# Patient Record
Sex: Female | Born: 1970 | Race: White | Hispanic: No | Marital: Married | Smoking: Never smoker
Health system: Southern US, Community
[De-identification: ages and names within clinical notes are randomized; demographics above are authoritative.]

## PROBLEM LIST (undated history)

## (undated) DIAGNOSIS — Z8249 Family history of ischemic heart disease and other diseases of the circulatory system: Secondary | ICD-10-CM

## (undated) DIAGNOSIS — E7800 Pure hypercholesterolemia, unspecified: Secondary | ICD-10-CM

## (undated) DIAGNOSIS — Z6826 Body mass index (BMI) 26.0-26.9, adult: Secondary | ICD-10-CM

## (undated) HISTORY — DX: Pure hypercholesterolemia, unspecified: E78.00

## (undated) HISTORY — DX: Family history of ischemic heart disease and other diseases of the circulatory system: Z82.49

## (undated) HISTORY — DX: Body mass index (BMI) 26.0-26.9, adult: Z68.26

---

## 1998-02-08 ENCOUNTER — Other Ambulatory Visit: Admission: RE | Admit: 1998-02-08 | Discharge: 1998-02-08 | Payer: Self-pay | Admitting: Obstetrics and Gynecology

## 1999-02-12 ENCOUNTER — Inpatient Hospital Stay (HOSPITAL_COMMUNITY): Admission: AD | Admit: 1999-02-12 | Discharge: 1999-02-14 | Payer: Self-pay | Admitting: Obstetrics and Gynecology

## 1999-03-25 ENCOUNTER — Other Ambulatory Visit: Admission: RE | Admit: 1999-03-25 | Discharge: 1999-03-25 | Payer: Self-pay | Admitting: Obstetrics and Gynecology

## 2000-04-19 ENCOUNTER — Other Ambulatory Visit: Admission: RE | Admit: 2000-04-19 | Discharge: 2000-04-19 | Payer: Self-pay | Admitting: Obstetrics and Gynecology

## 2001-09-15 ENCOUNTER — Other Ambulatory Visit: Admission: RE | Admit: 2001-09-15 | Discharge: 2001-09-15 | Payer: Self-pay | Admitting: Obstetrics and Gynecology

## 2002-09-18 ENCOUNTER — Other Ambulatory Visit: Admission: RE | Admit: 2002-09-18 | Discharge: 2002-09-18 | Payer: Self-pay | Admitting: Obstetrics and Gynecology

## 2003-03-26 ENCOUNTER — Inpatient Hospital Stay (HOSPITAL_COMMUNITY): Admission: RE | Admit: 2003-03-26 | Discharge: 2003-03-28 | Payer: Self-pay | Admitting: Obstetrics and Gynecology

## 2003-03-26 ENCOUNTER — Encounter: Payer: Self-pay | Admitting: Obstetrics and Gynecology

## 2003-03-26 ENCOUNTER — Encounter (INDEPENDENT_AMBULATORY_CARE_PROVIDER_SITE_OTHER): Payer: Self-pay | Admitting: Specialist

## 2003-04-27 ENCOUNTER — Other Ambulatory Visit: Admission: RE | Admit: 2003-04-27 | Discharge: 2003-04-27 | Payer: Self-pay | Admitting: Obstetrics and Gynecology

## 2004-06-19 ENCOUNTER — Other Ambulatory Visit: Admission: RE | Admit: 2004-06-19 | Discharge: 2004-06-19 | Payer: Self-pay | Admitting: Obstetrics and Gynecology

## 2005-09-07 ENCOUNTER — Other Ambulatory Visit: Admission: RE | Admit: 2005-09-07 | Discharge: 2005-09-07 | Payer: Self-pay | Admitting: Obstetrics and Gynecology

## 2008-04-09 ENCOUNTER — Encounter: Admission: RE | Admit: 2008-04-09 | Discharge: 2008-04-09 | Payer: Self-pay | Admitting: Obstetrics and Gynecology

## 2009-04-04 ENCOUNTER — Encounter: Admission: RE | Admit: 2009-04-04 | Discharge: 2009-04-04 | Payer: Self-pay | Admitting: Obstetrics and Gynecology

## 2010-10-27 ENCOUNTER — Encounter: Payer: Self-pay | Admitting: Obstetrics and Gynecology

## 2011-02-20 NOTE — Discharge Summary (Signed)
NAME:  Meagan Donovan, Meagan Donovan                      ACCOUNT NO.:  [id number]   MEDICAL RECORD NO.:  [id number]                   PATIENT TYPE:  INP   LOCATION:  9145                                 FACILITY:  [facility]   PHYSICIAN:  Dineen Kid. Rana Snare, M.D.                 DATE OF BIRTH:  Jan 12, 1971   DATE OF ADMISSION:  03/26/2003  DATE OF DISCHARGE:  03/28/2003                                 DISCHARGE SUMMARY   ADMISSION DIAGNOSES:  1. Intrauterine pregnancy at 38-1/7 weeks estimated gestational age.  2. Previous cesarean delivery times two.  3. Labor.  4. Desires permanent sterilization.   DISCHARGE DIAGNOSES:  1. Status post low-transverse cesarean section to a viable female infant.  2. Permanent sterilization.   PROCEDURES:  1. Repeat low-transverse cesarean section.  2. Bilateral tubal ligation, modified Pomeroy method.   REASON FOR ADMISSION:  Please see dictated H&P.   HOSPITAL COURSE:  #1.  The patient was a 40 year old gravida 3, para 2 that  was admitted to [facility] at 38-1/7 weeks who was having  uterine contractions and low abdominal pain.  She denied ruptured membranes.  She had had two previous cesarean deliveries and was scheduled for a  cesarean in approximately three days.  The patient had also desired  permanent sterilization.  The decision was made to proceed with the cesarean  delivery and the patient was taken to the operating room where spinal  anesthesia was administered without difficulty.  A low-transverse incision  was made with delivery of a viable female infant weight 7 pounds 2 ounces  with Apgar's of 8 at one minute and 9 at five minutes.  Bilateral tubal  ligation was performed.  The patient tolerated the procedure well and was  taken to the recovery room in stable condition.   #2.  On postoperative day one:  Vital signs were stable.  The patient had  good return of bowel function.  Fundus was firm and nontender.  Abdominal  dressing was  clean, dry and intact.  Labs revealed a hemoglobin of 9.3,  platelet count of 186,000, WBC count of 10.9.  On postoperative day two:  The patient desired early discharge.  Vital signs were stable.  Fundus was  firm, slightly tender to palpation.  The patient was ambulating well and  tolerating a regular diet without complaints of nausea and vomiting.  The  incision was clean, dry and intact.  Staples were intact.  Repeat CBC  revealed a hemoglobin of 8.7, platelet count of 181,000.  WBC count of 9.7.   CONDITION ON DISCHARGE:  Good.   DIET:  Regular as tolerated.   ACTIVITY:  No heavy lifting.  No driving x 2 weeks.  No vaginal entry.   DISCHARGE MEDICATIONS:  1. Percocet 5/325, #30, one p.o. every four to six hours p.r.n. pain.  2. Motrin 600 mg every six hours p.r.n.  3. Augmentin 875 mg  one p.o. b.i.d. x 7 days for a urinary tract infection.  4. Prenatal vitamins.  5. Colace one p.o. daily p.r.n.     Julio Sicks, N.P.                        Dineen Kid Rana Snare, M.D.    CC/MEDQ  D:  05/11/2003  T:  05/11/2003  Job:  [id number]

## 2011-02-20 NOTE — H&P (Signed)
   NAME:  Meagan Donovan, Meagan Donovan                      ACCOUNT NO.:  [id number]   MEDICAL RECORD NO.:  [id number]                   PATIENT TYPE:  INP   LOCATION:  9145                                 [facility]   PHYSICIAN:  Michelle L. Vincente Poli, M.D.            DATE OF BIRTH:  [birth year]   DATE OF ADMISSION:  03/26/2003  DATE OF DISCHARGE:                                HISTORY & PHYSICAL   [age]-year-old gravida 3, para 2, at 38-1/7 who presents complaining of  uterine contractions and lower abdominal pain.  She denies any rupture of  membranes.  She has had two previous cesarean sections and is scheduled for  C-section in three days.  She also desires permanent sterilization.   ALLERGIES:  None.   MEDICATIONS:  Prenatal vitamins.   EXAM:  She is afebrile with stable vital signs.  Fetal heart rate is  reactive.  Toco reveals very strong palpable contractions every two to three  minutes.  LUNGS:  Clear to auscultation bilaterally.  CARDIAC:  Regular rate and rhythm.  ABDOMEN:  Soft.  She has some tenderness over the uterus.   Urinalysis is remarkable for hemoglobin and positive leukocyte esterase.  A  renal ultrasound reveals bilateral moderate atelectasis with no stones seen.  White blood cell count is 10 and hemoglobin is 10.0.   IMPRESSION:  1. Intrauterine pregnancy at 38 weeks.  2. Labor.  3. Urinary tract infection.  4. Previous cesarean section x2.  5. Desires permanent sterilization.   PLAN:  Repeat low transverse cesarean section and bilateral tubal ligation.  Risks and benefits were reviewed with the patient.  She will proceed with  surgery and will give her Ancef at the time of cord clamp as well as twenty  four hours postop.                                               Michelle L. Vincente Poli, M.D.    Florestine Avers  D:  03/26/2003  T:  03/27/2003  Job:  [id number]

## 2011-02-20 NOTE — Op Note (Signed)
NAME:  Meagan Donovan, Meagan Donovan                      ACCOUNT NO.:  [id number]   MEDICAL RECORD NO.:  [id number]                   PATIENT TYPE:  INP   LOCATION:  9145                                 [facility]ILITY[facility]   PHYSICIAN:  Michelle L. Vincente Poli, M.D.            DATE OF BIRTH:  1971/01/29   DATE OF PROCEDURE:  03/26/2003  DATE OF DISCHARGE:                                 OPERATIVE REPORT   PREOPERATIVE DIAGNOSES:  Intrauterine pregnancy at 38 weeks, previous  cesarean sections x2, labor, and desires permanent sterilization.   POSTOPERATIVE DIAGNOSES:  Intrauterine pregnancy at 38 weeks, previous  cesarean sections x2, labor, and desires permanent sterilization.   PROCEDURE:  Repeat low transverse cesarean section and bilateral tubal  ligation, modified Pomeroy method.   SURGEON:  Michelle L. Vincente Poli, M.D.   ANESTHESIA:  Spinal.   ESTIMATED BLOOD LOSS:  500 cc.   FINDINGS:  A very thin lower uterine segment, female infant with Apgars 9 at  one minute and 9 at five minutes.   DESCRIPTION OF PROCEDURE:  The patient was taken to the operating room, she  was then given her spinal without incident.  A low transverse incision was  made in the abdomen after she was prepped and draped in the usual sterile  fashion.  A Foley catheter had already been inserted into the bladder.  The  incision was carried down to the fascia and the fascia was scored in the  midline and extended laterally.  A Pfannenstiel incision was then developed  and the rectus muscles were separated in the midline.  The peritoneum was  entered bluntly and the peritoneal incision was then stretched.  It was  noted that the lower uterine segment was ballooning and was extremely thin,  there was no bladder flap to develop but the uterus was scored one time and  entered easily with a hemostat.  Amniotic fluid was noted to be clear.  The  baby was in the cephalic presentation, there was a loose nuchal cord.  The  baby was  delivered easily, the infant was a female infant with Apgars 9 at  one minute and 9 at five minutes.  The placenta was manually removed and  noted to be normal.  The uterus was exteriorized and cleared of all clot and  debris, and the uterine incision was closed in a single layer using 0  chromic in a continuous running locked stitch.   We then turned our attention to the fallopian tubes where the bilateral  fimbriated ends were visualized.  Each mid portion was then grasped with  ______clamps and a 3-cm knuckle was then tied using plain gut suture x2.  Each knuckle of tissue that was tied off was then excised using Metzenbaum  scissors.  Hemostasis was noted.  The uterus was returned to the abdomen and  all pedicles and incisions were inspected and noted to be hemostatic.  The  peritoneum was  closed using 0 Vicryl continuous running stitch and the  rectus muscles were reapproximated using  10-0 Vicryl.  The fascia was closed in a single layer using 0 Vicryl in a  continuous running stitch.  The subcutaneous layer was noted to be  hemostatic and the skin was closed with staples.  All sponge, lap, and  instrument counts were correct x2, the patient tolerated the procedure well  and went to the recovery room in stable condition.                                               Michelle L. Vincente Poli, M.D.    Florestine Avers  D:  03/26/2003  T:  03/27/2003  Job:  [id number]

## 2011-05-26 ENCOUNTER — Other Ambulatory Visit: Payer: Self-pay | Admitting: Obstetrics and Gynecology

## 2011-05-26 DIAGNOSIS — R928 Other abnormal and inconclusive findings on diagnostic imaging of breast: Secondary | ICD-10-CM

## 2011-08-14 ENCOUNTER — Other Ambulatory Visit: Payer: Self-pay

## 2011-08-26 ENCOUNTER — Ambulatory Visit
Admission: RE | Admit: 2011-08-26 | Discharge: 2011-08-26 | Disposition: A | Discharge status: Home / Self Care | Payer: BC Managed Care – PPO | Source: Ambulatory Visit | Attending: Obstetrics and Gynecology | Admitting: Obstetrics and Gynecology

## 2011-08-26 DIAGNOSIS — R928 Other abnormal and inconclusive findings on diagnostic imaging of breast: Secondary | ICD-10-CM

## 2015-05-08 ENCOUNTER — Other Ambulatory Visit: Payer: Self-pay | Admitting: Obstetrics and Gynecology

## 2015-05-09 LAB — CYTOLOGY - PAP

## 2015-05-13 ENCOUNTER — Other Ambulatory Visit: Payer: Self-pay | Admitting: Obstetrics and Gynecology

## 2015-05-13 DIAGNOSIS — R928 Other abnormal and inconclusive findings on diagnostic imaging of breast: Secondary | ICD-10-CM

## 2015-05-17 ENCOUNTER — Ambulatory Visit
Admission: RE | Admit: 2015-05-17 | Discharge: 2015-05-17 | Disposition: A | Discharge status: Home / Self Care | Payer: BC Managed Care – PPO | Source: Ambulatory Visit | Attending: Obstetrics and Gynecology | Admitting: Obstetrics and Gynecology

## 2015-05-17 DIAGNOSIS — R928 Other abnormal and inconclusive findings on diagnostic imaging of breast: Secondary | ICD-10-CM

## 2015-10-06 IMAGING — MG MM DIAG BREAST TOMO UNI LEFT
4 series · 4 of 12 positions shown · non-contrast
Comparison: Previous exam(s).

CLINICAL DATA: Screening recall for an asymmetry seen in the left
breast on the CC view only.

EXAM:
DIGITAL DIAGNOSTIC LEFT MAMMOGRAM WITH 3D TOMOSYNTHESIS AND CAD

[L MLO]
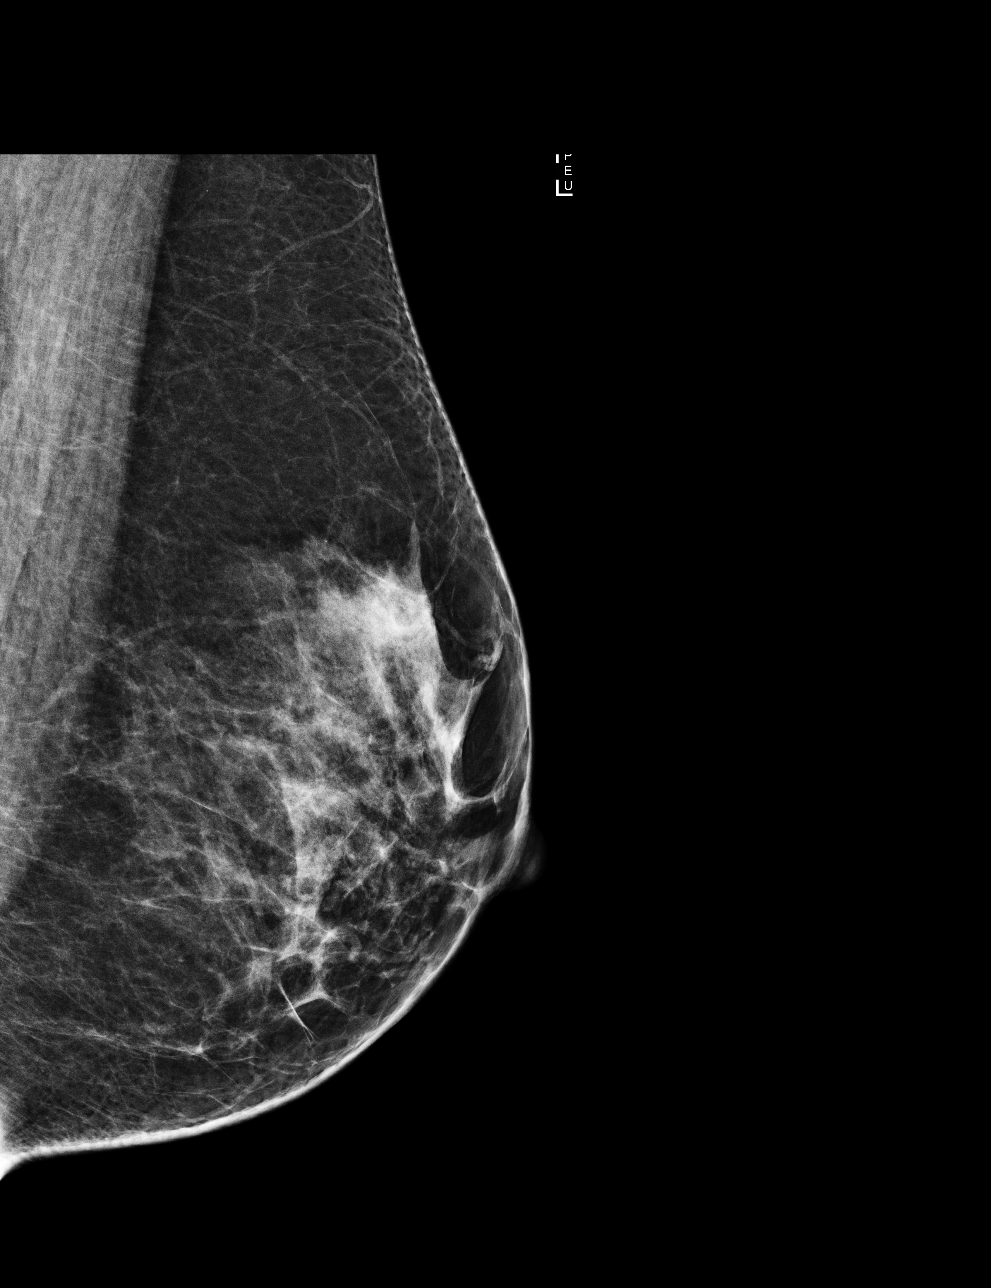

[L CC]
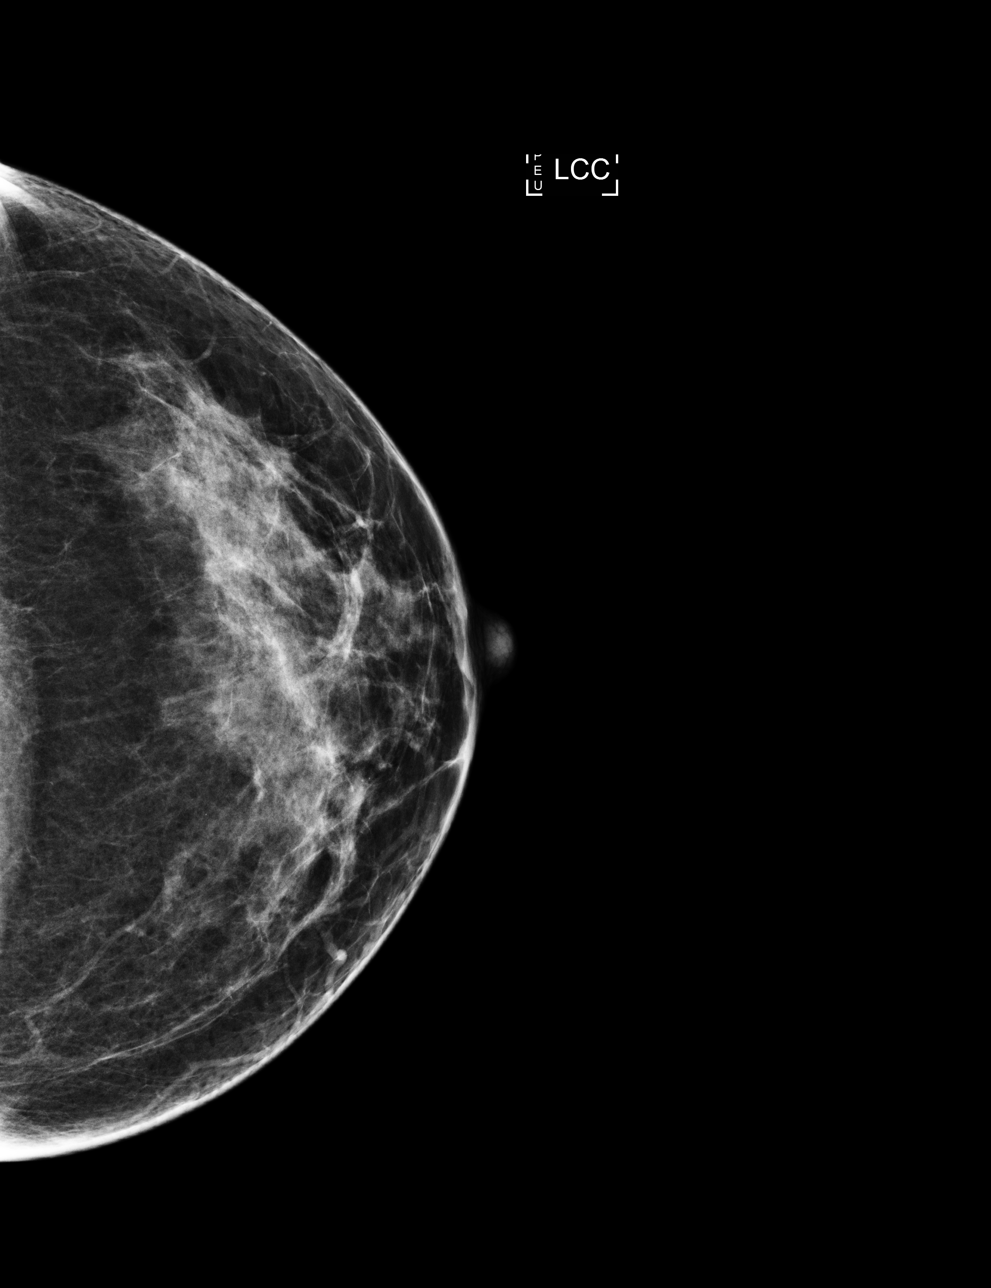

[L MLO tomo · tomo slice 28/55.0]
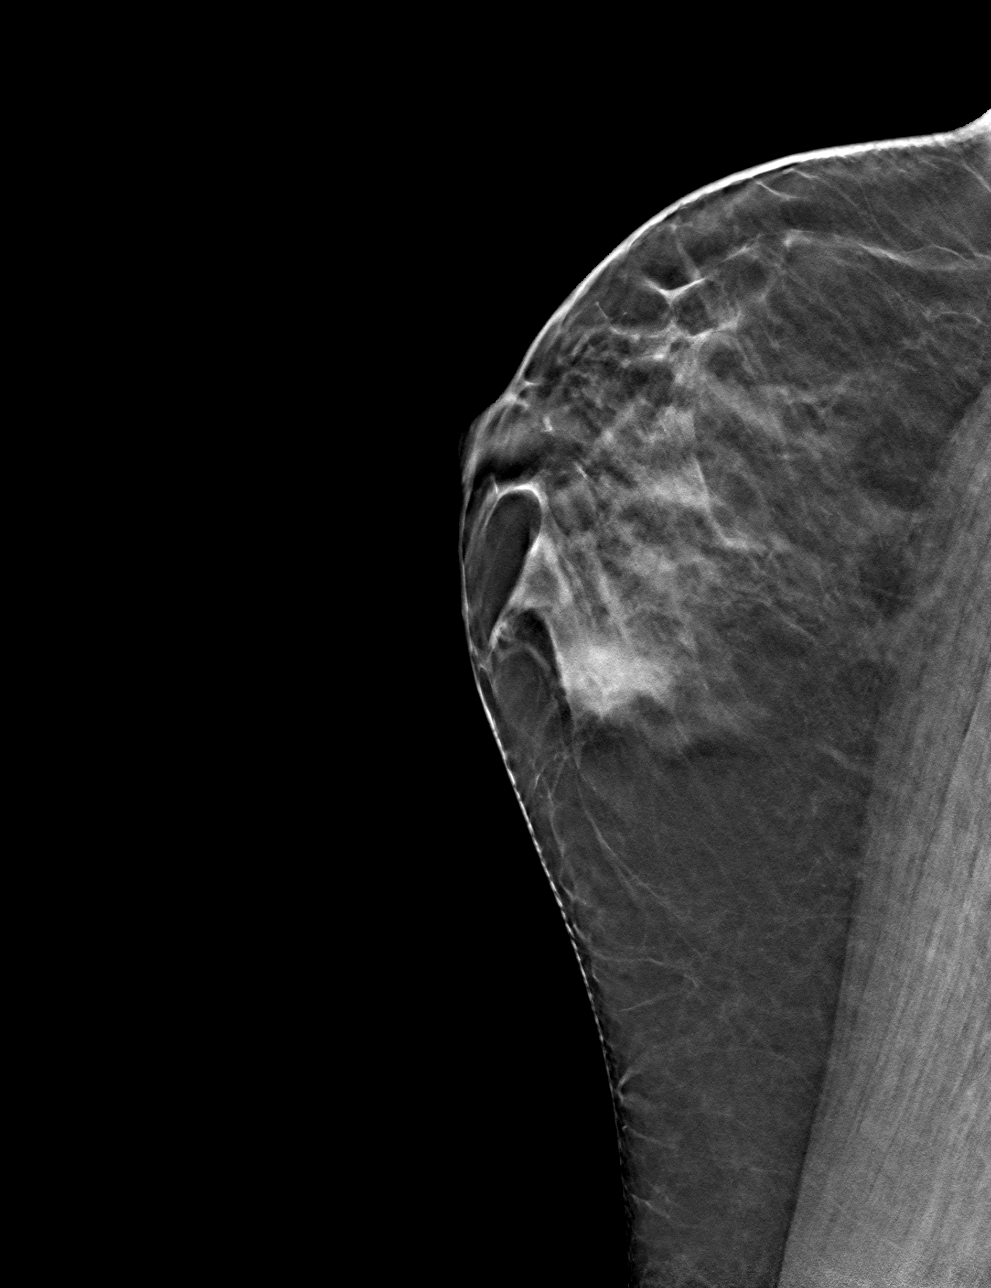

[L CC tomo · tomo slice 27/54.0]
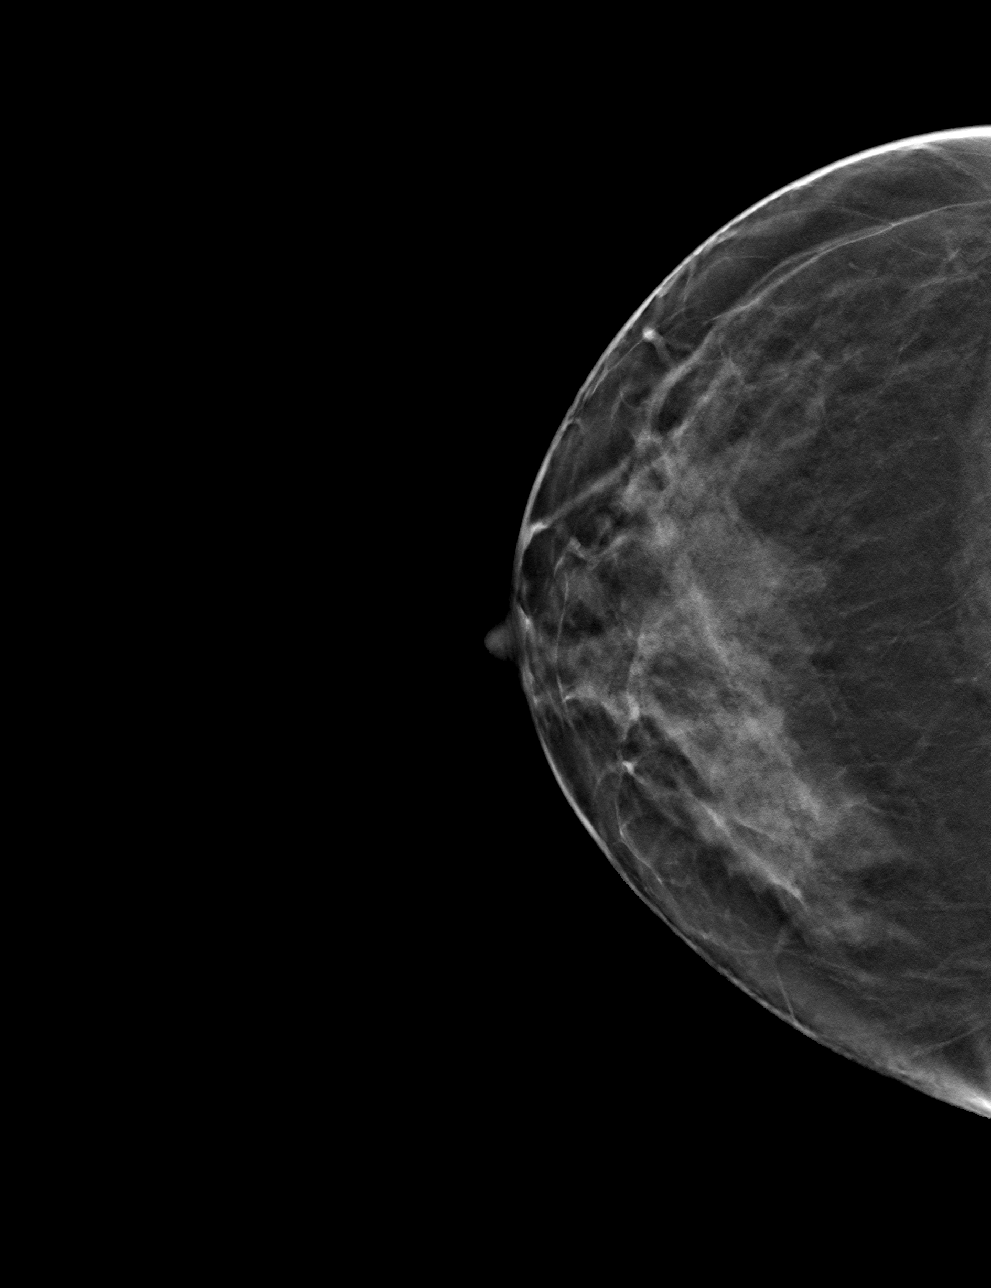

[4 of 12 positions shown; findings below may reference images not displayed]

ACR Breast Density Category c: The breast tissue is heterogeneously
dense, which may obscure small masses.
FINDINGS: Cc and MLO tomosynthesis was performed of the left breast. The
initially questioned left breast asymmetry resolves on the
additional imaging with findings compatible with overlapping vessels
and tissue. There is no mammographic evidence of in the left breast.

Mammographic images were processed with CAD.
IMPRESSION: Initially questioned left breast asymmetry resolves on the
additional imaging with findings compatible with overlapping
structures. There is no mammographic evidence of malignancy in the
left breast.

RECOMMENDATION:
Screening mammogram in one year.(Code:TY-[id number])

I have discussed the findings and recommendations with the patient.
Results were also provided in writing at the conclusion of the
visit. If applicable, a reminder letter will be sent to the patient
regarding the next appointment.

BI-RADS CATEGORY  1: Negative.

## 2019-10-10 ENCOUNTER — Telehealth: Payer: Self-pay

## 2019-10-10 NOTE — Telephone Encounter (Signed)
LMOVM to schedule lab appt  

## 2021-04-15 DIAGNOSIS — E7800 Pure hypercholesterolemia, unspecified: Secondary | ICD-10-CM

## 2021-04-15 HISTORY — DX: Pure hypercholesterolemia, unspecified: E78.00

## 2023-03-31 ENCOUNTER — Other Ambulatory Visit (HOSPITAL_BASED_OUTPATIENT_CLINIC_OR_DEPARTMENT_OTHER): Payer: Self-pay | Admitting: Obstetrics and Gynecology

## 2023-03-31 DIAGNOSIS — Z8249 Family history of ischemic heart disease and other diseases of the circulatory system: Secondary | ICD-10-CM

## 2023-04-15 ENCOUNTER — Ambulatory Visit (HOSPITAL_COMMUNITY)
Admission: RE | Admit: 2023-04-15 | Discharge: 2023-04-15 | Disposition: A | Discharge status: Home / Self Care | Payer: BC Managed Care – PPO | Source: Ambulatory Visit | Attending: Obstetrics and Gynecology | Admitting: Obstetrics and Gynecology

## 2023-04-15 DIAGNOSIS — Z8249 Family history of ischemic heart disease and other diseases of the circulatory system: Secondary | ICD-10-CM | POA: Insufficient documentation

## 2023-07-29 ENCOUNTER — Other Ambulatory Visit: Payer: Self-pay

## 2023-07-29 DIAGNOSIS — Z8249 Family history of ischemic heart disease and other diseases of the circulatory system: Secondary | ICD-10-CM | POA: Insufficient documentation

## 2023-07-29 DIAGNOSIS — Z6826 Body mass index (BMI) 26.0-26.9, adult: Secondary | ICD-10-CM | POA: Insufficient documentation

## 2023-07-29 DIAGNOSIS — E7800 Pure hypercholesterolemia, unspecified: Secondary | ICD-10-CM | POA: Insufficient documentation

## 2023-07-29 NOTE — Progress Notes (Unsigned)
Cardiology Office Note:    Date:  07/30/2023   ID:  ERICK OCHELTREE, DOB [birth year], MRN [id number]  PCP:  Lise Auer, MD  Cardiologist:  Norman Herrlich, MD   Referring MD: Lise Auer, MD  ASSESSMENT:    1. Hypercholesterolemia   2. Cardiac risk counseling   3. Agatston coronary artery calcium score less than 100    PLAN:    In order of problems listed above:  Fortunately her cardiac CTA is quite close to normal with a score of 1 reassuring and I encouraged her to continue lipid-lowering therapy and she agrees.  Her outlook over the short and intermediate term is very good. Small benign pulmonary nodule of no concern I will plan to see her back in the office in the future as needed     Medication Adjustments/Labs and Tests Ordered: Current medicines are reviewed at length with the patient today.  Concerns regarding medicines are outlined above.  Orders Placed This Encounter  Procedures   EKG 12-Lead   No orders of the defined types were placed in this encounter.    No chief complaint on file.   History of Present Illness:    Meagan Donovan is a 52 y.o. female with a history of hyperlipidemia treated with a statin who is being seen today for the evaluation of cardiovascular risk and results of her cardiac CTA at the request of Lise Auer, MD.  Lipid profile 05/27/2023 cholesterol 182 LDL 2 triglycerides 171 HDL 59 non-HDL cholesterol 123  She alerted me she had already had a coronary calcium score performed as able to pull the report her coronary artery score was near 0 at 1.0 she has had a small benign pulmonary nodule. She has hyperlipidemia and has been taken lipid-lowering therapy with rosuvastatin well-tolerated no muscle symptoms or side effects She is very interested in avoiding early onset vascular disease She has no background history of heart disease congenital rheumatic or atrial fibrillation She is not experiencing cardiovascular symptoms of  edema shortness of breath chest pain palpitation or syncope Past Medical History:  Diagnosis Date   BMI 26.0-26.9,adult    Elevated LDL cholesterol level    Family history of premature coronary artery disease    Hypercholesterolemia 04/15/2021    Past Surgical History:  Procedure Laterality Date   CESAREAN SECTION      Current Medications: Current Meds  Medication Sig   rosuvastatin (CRESTOR) 10 MG tablet Take 10 mg by mouth daily.     Allergies:   Patient has no known allergies.   Social History   Socioeconomic History   Marital status: Married    Spouse name: Not on file   Number of children: Not on file   Years of education: Not on file   Highest education level: Not on file  Occupational History   Not on file  Tobacco Use   Smoking status: Never   Smokeless tobacco: Never  Substance and Sexual Activity   Alcohol use: Not on file   Drug use: Not on file   Sexual activity: Not on file  Other Topics Concern   Not on file  Social History Narrative   Not on file   Social Determinants of Health   Financial Resource Strain: Not on file  Food Insecurity: Not on file  Transportation Needs: Not on file  Physical Activity: Not on file  Stress: Not on file  Social Connections: Not on file     Family History: The  patient's family history includes Breast cancer in her maternal grandmother; CAD in her father.  ROS:   ROS Please see the history of present illness.     All other systems reviewed and are negative.  EKGs/Labs/Other Studies Reviewed:    The following studies were reviewed today:   Cardiac Studies & Procedures          CT SCANS  CT CARDIAC SCORING (SELF PAY ONLY) 04/15/2023  Addendum 04/21/2023  8:19 AM ADDENDUM REPORT: 04/21/2023 08:17  EXAM: OVER-READ INTERPRETATION  CT CHEST  The following report is an over-read performed by radiologist Dr. Noe Gens [facility] on 04/21/2023. This over-read does not include  interpretation of cardiac or coronary anatomy or pathology. The coronary calcium score interpretation by the cardiologist is attached.  COMPARISON:  None.  FINDINGS: Heart is normal size. Aorta normal caliber. No adenopathy. Small nodule in the right middle lobe along the minor fissure measuring 3 mm. No confluent opacities or effusions. No acute findings in the upper abdomen. Chest wall soft tissues are unremarkable. No acute bony abnormality.  IMPRESSION: 3 mm right middle lobe pulmonary nodule. No follow-up needed if patient is low-risk.This recommendation follows the consensus statement: Guidelines for Management of Incidental Pulmonary Nodules Detected on CT Images: From the Fleischner Society 2017; Radiology 2017; 284:228-243.   Electronically Signed By: Charlett Nose M.D. On: 04/21/2023 08:17  Narrative : Cardiovascular Disease Risk stratification  EXAM: Coronary Calcium Score  TECHNIQUE: A gated, non-contrast computed tomography scan of the heart was performed using 3mm slice thickness. Axial images were analyzed on a dedicated workstation. Calcium scoring of the coronary arteries was performed using the Agatston method.  FINDINGS: Coronary arteries: Normal origins.  Coronary Calcium Score:  Left main: 0  Left anterior descending artery: 1.01  Left circumflex artery: 0  Right coronary artery: 0  Total: 1.01  Percentile: 54  Pericardium: Normal.  Ascending Aorta: Normal caliber.  Non-cardiac: See separate report from [facility].  IMPRESSION: Coronary calcium score of 1.01. This was 29 percentile for age-, race-, and sex-matched controls.  RECOMMENDATIONS: Coronary artery calcium (CAC) score is a strong predictor of incident coronary heart disease (CHD) and provides predictive information beyond traditional risk factors. CAC scoring is reasonable to use in the decision to withhold, postpone, or initiate statin therapy in  intermediate-risk or selected borderline-risk asymptomatic adults (age 3-75 years and LDL-C >=70 to <190 mg/dL) who do not have diabetes or established atherosclerotic cardiovascular disease (ASCVD).* In intermediate-risk (10-year ASCVD risk >=7.5% to <20%) adults or selected borderline-risk (10-year ASCVD risk >=5% to <7.5%) adults in whom a CAC score is measured for the purpose of making a treatment decision the following recommendations have been made:  If CAC=0, it is reasonable to withhold statin therapy and reassess in 5 to 10 years, as long as higher risk conditions are absent (diabetes mellitus, family history of premature CHD in first degree relatives (males <55 years; females <65 years), cigarette smoking, or LDL >=190 mg/dL).  If CAC is 1 to 99, it is reasonable to initiate statin therapy for patients >=68 years of age.  If CAC is >=100 or >=75th percentile, it is reasonable to initiate statin therapy at any age.  Cardiology referral should be considered for patients with CAC scores >=400 or >=75th percentile.  *2018 AHA/ACC/AACVPR/AAPA/ABC/ACPM/ADA/AGS/APhA/ASPC/NLA/PCNA Guideline on the Management of Blood Cholesterol: A Report of the American College of Cardiology/American Heart Association Task Force on Clinical Practice Guidelines. J Am Coll Cardiol. 2019;73(24):3168-3209.  Kardie Tobb, DO  The noncardiac portion of this study will be interpreted in separate report by the radiologist.  Electronically Signed: By: Thomasene Ripple D.O. On: 04/15/2023 13:43         EKG Interpretation Date/Time:  Friday July 30 2023 15:50:26 EDT Ventricular Rate:  71 PR Interval:  168 QRS Duration:  70 QT Interval:  390 QTC Calculation: 423 R Axis:   -70  Text Interpretation: Normal sinus rhythm Left axis deviation No previous ECGs available Confirmed by Norman Herrlich ([id number]) on 07/30/2023 3:55:52 PM    Physical Exam:    VS:  BP 100/70   Pulse 71   Ht 5' 2.6" (1.59  m)   Wt 149 lb 9.6 oz (67.9 kg)   SpO2 96%   BMI 26.84 kg/m     Wt Readings from Last 3 Encounters:  07/30/23 149 lb 9.6 oz (67.9 kg)     GEN: She has no arcus senilis Homero Fellers sign xanthoma or xanthelasma well nourished, well developed in no acute distress HEENT: Normal NECK: No JVD; No carotid bruits LYMPHATICS: No lymphadenopathy CARDIAC: RRR, no murmurs, rubs, gallops RESPIRATORY:  Clear to auscultation without rales, wheezing or rhonchi  ABDOMEN: Soft, non-tender, non-distended MUSCULOSKELETAL:  No edema; No deformity  SKIN: Warm and dry NEUROLOGIC:  Alert and oriented x 3 PSYCHIATRIC:  Normal affect     Signed, Norman Herrlich, MD  07/30/2023 4:44 PM    [facility]

## 2023-07-29 NOTE — Progress Notes (Deleted)
Cardiology Office Note:    Date:  07/29/2023   ID:  Meagan Donovan, DOB Feb 25, 1971, MRN [id number]  PCP:  Lise Auer, MD  Cardiologist:  Norman Herrlich, MD    Referring MD: Lise Auer, MD    ASSESSMENT:    No diagnosis found. PLAN:    In order of problems listed above:  ***   Next appointment: ***   Medication Adjustments/Labs and Tests Ordered: Current medicines are reviewed at length with the patient today.  Concerns regarding medicines are outlined above.  No orders of the defined types were placed in this encounter.  No orders of the defined types were placed in this encounter.    History of Present Illness:    Meagan Donovan is a 52 y.o. female with a hx of *** last seen ***. Compliance with diet, lifestyle and medications: *** Past Medical History:  Diagnosis Date   BMI 26.0-26.9,adult    Elevated LDL cholesterol level    Family history of premature coronary artery disease    Hypercholesterolemia 04/15/2021    Current Medications: No outpatient medications have been marked as taking for the 07/30/23 encounter (Appointment) with Baldo Daub, MD.      EKGs/Labs/Other Studies Reviewed:    The following studies were reviewed today:  Cardiac Studies & Procedures          CT SCANS  CT CARDIAC SCORING (SELF PAY ONLY) 04/15/2023  Addendum 04/21/2023  8:19 AM ADDENDUM REPORT: 04/21/2023 08:17  EXAM: OVER-READ INTERPRETATION  CT CHEST  The following report is an over-read performed by radiologist Dr. Noe Gens [facility] on 04/21/2023. This over-read does not include interpretation of cardiac or coronary anatomy or pathology. The coronary calcium score interpretation by the cardiologist is attached.  COMPARISON:  None.  FINDINGS: Heart is normal size. Aorta normal caliber. No adenopathy. Small nodule in the right middle lobe along the minor fissure measuring 3 mm. No confluent opacities or effusions. No acute  findings in the upper abdomen. Chest wall soft tissues are unremarkable. No acute bony abnormality.  IMPRESSION: 3 mm right middle lobe pulmonary nodule. No follow-up needed if patient is low-risk.This recommendation follows the consensus statement: Guidelines for Management of Incidental Pulmonary Nodules Detected on CT Images: From the Fleischner Society 2017; Radiology 2017; 284:228-243.   Electronically Signed By: Charlett Nose M.D. On: 04/21/2023 08:17  Narrative : Cardiovascular Disease Risk stratification  EXAM: Coronary Calcium Score  TECHNIQUE: A gated, non-contrast computed tomography scan of the heart was performed using 3mm slice thickness. Axial images were analyzed on a dedicated workstation. Calcium scoring of the coronary arteries was performed using the Agatston method.  FINDINGS: Coronary arteries: Normal origins.  Coronary Calcium Score:  Left main: 0  Left anterior descending artery: 1.01  Left circumflex artery: 0  Right coronary artery: 0  Total: 1.01  Percentile: 54  Pericardium: Normal.  Ascending Aorta: Normal caliber.  Non-cardiac: See separate report from [facility].  IMPRESSION: Coronary calcium score of 1.01. This was 76 percentile for age-, race-, and sex-matched controls.  RECOMMENDATIONS: Coronary artery calcium (CAC) score is a strong predictor of incident coronary heart disease (CHD) and provides predictive information beyond traditional risk factors. CAC scoring is reasonable to use in the decision to withhold, postpone, or initiate statin therapy in intermediate-risk or selected borderline-risk asymptomatic adults (age 48-75 years and LDL-C >=70 to <190 mg/dL) who do not have diabetes or established atherosclerotic cardiovascular disease (ASCVD).* In intermediate-risk (10-year ASCVD risk >=7.5%  to <20%) adults or selected borderline-risk (10-year ASCVD risk >=5% to <7.5%) adults in whom a CAC score is  measured for the purpose of making a treatment decision the following recommendations have been made:  If CAC=0, it is reasonable to withhold statin therapy and reassess in 5 to 10 years, as long as higher risk conditions are absent (diabetes mellitus, family history of premature CHD in first degree relatives (males <55 years; females <65 years), cigarette smoking, or LDL >=190 mg/dL).  If CAC is 1 to 99, it is reasonable to initiate statin therapy for patients >=44 years of age.  If CAC is >=100 or >=75th percentile, it is reasonable to initiate statin therapy at any age.  Cardiology referral should be considered for patients with CAC scores >=400 or >=75th percentile.  *2018 AHA/ACC/AACVPR/AAPA/ABC/ACPM/ADA/AGS/APhA/ASPC/NLA/PCNA Guideline on the Management of Blood Cholesterol: A Report of the American College of Cardiology/American Heart Association Task Force on Clinical Practice Guidelines. J Am Coll Cardiol. 2019;73(24):3168-3209.  Thomasene Ripple, DO  The noncardiac portion of this study will be interpreted in separate report by the radiologist.  Electronically Signed: By: Thomasene Ripple D.O. On: 04/15/2023 13:43              Recent Labs: No results found for requested labs within last 365 days.  Recent Lipid Panel No results found for: "CHOL", "TRIG", "HDL", "CHOLHDL", "VLDL", "LDLCALC", "LDLDIRECT"  Physical Exam:    VS:  There were no vitals taken for this visit.    Wt Readings from Last 3 Encounters:  No data found for Wt     GEN: *** Well nourished, well developed in no acute distress HEENT: Normal NECK: No JVD; No carotid bruits LYMPHATICS: No lymphadenopathy CARDIAC: ***RRR, no murmurs, rubs, gallops RESPIRATORY:  Clear to auscultation without rales, wheezing or rhonchi  ABDOMEN: Soft, non-tender, non-distended MUSCULOSKELETAL:  No edema; No deformity  SKIN: Warm and dry NEUROLOGIC:  Alert and oriented x 3 PSYCHIATRIC:  Normal affect     Signed, Norman Herrlich, MD  07/29/2023 3:55 PM    [facility]

## 2023-07-30 ENCOUNTER — Ambulatory Visit: Payer: BC Managed Care – PPO | Attending: Cardiology | Admitting: Cardiology

## 2023-07-30 ENCOUNTER — Encounter: Payer: Self-pay | Admitting: Cardiology

## 2023-07-30 VITALS — BP 100/70 | HR 71 | Ht 62.6 in | Wt 149.6 lb

## 2023-07-30 DIAGNOSIS — Z7189 Other specified counseling: Secondary | ICD-10-CM

## 2023-07-30 DIAGNOSIS — E7800 Pure hypercholesterolemia, unspecified: Secondary | ICD-10-CM

## 2023-07-30 DIAGNOSIS — R931 Abnormal findings on diagnostic imaging of heart and coronary circulation: Secondary | ICD-10-CM

## 2023-07-30 NOTE — Patient Instructions (Signed)
Medication Instructions:  Your physician recommends that you continue on your current medications as directed. Please refer to the Current Medication list given to you today.  *If you need a refill on your cardiac medications before your next appointment, please call your pharmacy*   Lab Work: None Ordered If you have labs (blood work) drawn today and your tests are completely normal, you will receive your results only by: MyChart Message (if you have MyChart) OR A paper copy in the mail If you have any lab test that is abnormal or we need to change your treatment, we will call you to review the results.   Testing/Procedures: None Ordered   Follow-Up: At [facility], you and your health needs are our priority.  As part of our continuing mission to provide you with exceptional heart care, we have created designated Provider Care Teams.  These Care Teams include your primary Cardiologist (physician) and Advanced Practice Providers (APPs -  Physician Assistants and Nurse Practitioners) who all work together to provide you with the care you need, when you need it.  We recommend signing up for the patient portal called "MyChart".  Sign up information is provided on this After Visit Summary.  MyChart is used to connect with patients for Virtual Visits (Telemedicine).  Patients are able to view lab/test results, encounter notes, upcoming appointments, etc.  Non-urgent messages can be sent to your provider as well.   To learn more about what you can do with MyChart, go to [email or web address].    Your next appointment:     The format for your next appointment:   In Person  Provider:   Norman Herrlich, MD   Other Instructions NA
